# Patient Record
Sex: Male | Born: 2000 | Race: White | Hispanic: No | Marital: Single | Smoking: Current every day smoker
Health system: Southern US, Community
[De-identification: ages and names within clinical notes are randomized; demographics above are authoritative.]

## PROBLEM LIST (undated history)

## (undated) DIAGNOSIS — R519 Headache, unspecified: Secondary | ICD-10-CM

## (undated) DIAGNOSIS — D4989 Neoplasm of unspecified behavior of other specified sites: Secondary | ICD-10-CM

## (undated) DIAGNOSIS — F419 Anxiety disorder, unspecified: Secondary | ICD-10-CM

## (undated) DIAGNOSIS — F32A Depression, unspecified: Secondary | ICD-10-CM

## (undated) DIAGNOSIS — H539 Unspecified visual disturbance: Secondary | ICD-10-CM

## (undated) DIAGNOSIS — R51 Headache: Secondary | ICD-10-CM

## (undated) DIAGNOSIS — Z8489 Family history of other specified conditions: Secondary | ICD-10-CM

---
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
## 2021-12-08 DIAGNOSIS — F411 Generalized anxiety disorder: Secondary | ICD-10-CM | POA: Insufficient documentation

## 2021-12-08 DIAGNOSIS — K219 Gastro-esophageal reflux disease without esophagitis: Secondary | ICD-10-CM | POA: Insufficient documentation

## 2023-12-25 ENCOUNTER — Other Ambulatory Visit: Payer: Self-pay

## 2023-12-25 ENCOUNTER — Emergency Department (HOSPITAL_COMMUNITY)
Admission: EM | Admit: 2023-12-25 | Discharge: 2023-12-25 | Disposition: A | Discharge status: Home / Self Care | Attending: Emergency Medicine | Admitting: Emergency Medicine

## 2023-12-25 ENCOUNTER — Encounter (HOSPITAL_COMMUNITY): Payer: Self-pay

## 2023-12-25 DIAGNOSIS — L0231 Cutaneous abscess of buttock: Secondary | ICD-10-CM | POA: Insufficient documentation

## 2023-12-25 MED ORDER — DOXYCYCLINE HYCLATE 100 MG PO CAPS: 100.0000 mg | ORAL_CAPSULE | Freq: Two times a day (BID) | ORAL | 0 refills | Status: DC

## 2023-12-25 MED ORDER — DOXYCYCLINE HYCLATE 100 MG PO TABS
100.0000 mg | ORAL_TABLET | Freq: Once | ORAL | Status: AC
  Administered 2023-12-25: 100 mg via ORAL
  Filled 2023-12-25: qty 1

## 2023-12-25 NOTE — ED Notes (Signed)
 ED Provider at bedside.

## 2023-12-25 NOTE — ED Triage Notes (Signed)
 Pt states that he has abscess to his right butt check area that has been there approx 1 year. States that it has become painful and red for a while now.

## 2023-12-25 NOTE — ED Provider Notes (Signed)
  [place] EMERGENCY DEPARTMENT AT [facility] Provider Note   CSN: [id number] Arrival date & time: 12/25/23  1856     History {Add pertinent medical, surgical, social history, OB history to HPI:1} Chief Complaint  Patient presents with   Abscess    Spencer Flores is a 23 y.o. male.  The history is provided by the patient.       Home Medications Prior to Admission medications   Medication Sig Start Date End Date Taking? Authorizing Provider  doxycycline (VIBRAMYCIN) 100 MG capsule Take 1 capsule (100 mg total) by mouth 2 (two) times daily. 12/25/23  Yes Vidur Knust, PA-C  ibuprofen  (ADVIL ,MOTRIN ) 400 MG tablet Take 1 tablet (400 mg total) by mouth every 6 (six) hours as needed. Patient not taking: Reported on 06/17/2018 05/17/18   Sueellen Emery, MD  omeprazole  (PRILOSEC) 20 MG capsule Take 1 capsule (20 mg total) by mouth daily as needed. Patient not taking: Reported on 10/31/2018 06/17/18   Creighton Longley, PA-C      Allergies    No known allergies    Review of Systems   Review of Systems  Physical Exam Updated Vital Signs BP 116/79   Pulse 60   Temp 98.2 F (36.8 C) (Oral)   Resp 17   Wt 59 kg   SpO2 98%  Physical Exam  ED Results / Procedures / Treatments   Labs (all labs ordered are listed, but only abnormal results are displayed) Labs Reviewed - No data to display  EKG None  Radiology No results found.  Procedures Procedures  {Document cardiac monitor, telemetry assessment procedure when appropriate:1}  Medications Ordered in ED Medications  doxycycline (VIBRA-TABS) tablet 100 mg (100 mg Oral Given 12/25/23 2100)    ED Course/ Medical Decision Making/ A&P   {   Click here for ABCD2, HEART and other calculatorsREFRESH Note before signing :1}                              Medical Decision Making Risk Prescription drug management.   ***  {Document critical care time when appropriate:1} {Document review of labs and clinical  decision tools ie heart score, Chads2Vasc2 etc:1}  {Document your independent review of radiology images, and any outside records:1} {Document your discussion with family members, caretakers, and with consultants:1} {Document social determinants of health affecting pt's care:1} {Document your decision making why or why not admission, treatments were needed:1} Final Clinical Impression(s) / ED Diagnoses Final diagnoses:  Abscess of buttock, right    Rx / DC Orders ED Discharge Orders          Ordered    doxycycline (VIBRAMYCIN) 100 MG capsule  2 times daily        12/25/23 2049

## 2023-12-25 NOTE — ED Notes (Signed)
 Pt changed pharmacy during dc. EDPA made aware.

## 2023-12-25 NOTE — Discharge Instructions (Signed)
 Continue doing your warm water soaks as discussed.  I recommend twice daily for 5 to 10 minutes either with the shower water or as a tub soak.  Take the entire course of the antibiotics prescribed.  Since this continues to return you may need to consider definitive treatment by surgical removal of this site to keep this abscess from returning.  Dr. Collene Dawson, our general surgeon would be the person to discuss this with, call her if you decide to go this route.

## 2024-01-08 ENCOUNTER — Encounter: Payer: Self-pay | Admitting: General Surgery

## 2024-01-08 ENCOUNTER — Encounter: Payer: Self-pay | Admitting: *Deleted

## 2024-01-08 ENCOUNTER — Ambulatory Visit: Payer: Self-pay | Admitting: General Surgery

## 2024-01-08 VITALS — BP 104/68 | HR 48 | Temp 95.0°F | Resp 12 | Ht 72.0 in | Wt 127.0 lb

## 2024-01-08 DIAGNOSIS — K603 Anal fistula, unspecified: Secondary | ICD-10-CM | POA: Diagnosis not present

## 2024-01-08 MED ORDER — AMOXICILLIN-POT CLAVULANATE 875-125 MG PO TABS: 1.0000 | ORAL_TABLET | Freq: Two times a day (BID) | ORAL | 0 refills | Status: DC

## 2024-01-08 NOTE — Patient Instructions (Addendum)
 Take the antibiotic as prescribed.

## 2024-01-08 NOTE — Progress Notes (Signed)
 [facility] History and Physical  Reason for Referral:*** Referring Physician: ***  Chief Complaint   New Patient (Initial Visit)     Spencer Flores is a 23 y.o. male.  HPI:   Discussed the use of AI scribe software for clinical note transcription with the patient, who gave verbal consent to proceed.  History of Present Illness      ***.  The *** started *** and has had a duration of ***.  It is associated with ***.  The *** is improved with ***, and is made worse with ***.    Quality*** Context***  Past Medical History:  Diagnosis Date  . Family history of adverse reaction to anesthesia     Pt mother had spinal headache; pt grandmother had PONV  . Headache   . Tumor of jaw    left mandible  . Vision abnormalities    wears glasses    Past Surgical History:  Procedure Laterality Date  . MASS EXCISION Left 07/28/2016   Procedure: REMOVAL OF CYST IN LEFT JAW;  Surgeon: Glendia Primrose, DDS;  Location: [facility];  Service: Oral Surgery;  Laterality: Left;    Family History  Problem Relation Age of Onset  . Healthy Mother   . Diverticulitis Father   . Diabetes Maternal Grandmother     Social History   Tobacco Use  . Smoking status: Every Day    Types: E-cigarettes  . Smokeless tobacco: Never  Vaping Use  . Vaping status: Every Day  Substance Use Topics  . Alcohol use: No  . Drug use: Not Currently    Types: Marijuana    Medications: {medication reviewed/display:3041432} Allergies as of 01/08/2024       Reactions   No Known Allergies         Medication List        Accurate as of January 08, 2024 11:13 AM. If you have any questions, ask your nurse or doctor.          STOP taking these medications    omeprazole  20 MG capsule Commonly known as: PRILOSEC Stopped by: Manuelita JAYSON Pander       TAKE these medications    doxycycline  100 MG capsule Commonly known as: VIBRAMYCIN  Take 1 capsule (100 mg total) by mouth 2 (two) times  daily.   ibuprofen  400 MG tablet Commonly known as: ADVIL  Take 1 tablet (400 mg total) by mouth every 6 (six) hours as needed.   sertraline 50 MG tablet Commonly known as: ZOLOFT Take 50 mg by mouth daily.         ROS:  {Review of Systems:30496}  Blood pressure 104/68, pulse (!) 48, temperature (!) 95 F (35 C), temperature source Oral, resp. rate 12, height 6' (1.829 m), weight 127 lb (57.6 kg), SpO2 95%. Physical Exam Physical Exam   Results: No results found for this or any previous visit (from the past 48 hours).  No results found.   Assessment and Plan: Assessment and Plan Assessment & Plan      Spencer Flores is a 23 y.o. male with *** -*** -*** -Follow up ***  All questions were answered to the satisfaction of the patient and family***.  The risk and benefits of *** were discussed including but not limited to ***.  After careful consideration, Spencer Flores has decided to ***.    Manuelita JAYSON Pander 01/08/2024, 11:13 AM

## 2024-01-09 ENCOUNTER — Telehealth: Payer: Self-pay | Admitting: *Deleted

## 2024-01-09 DIAGNOSIS — K610 Anal abscess: Secondary | ICD-10-CM | POA: Insufficient documentation

## 2024-01-09 MED ORDER — AMOXICILLIN-POT CLAVULANATE 400-57 MG/5ML PO SUSR: 11.0000 mL | Freq: Two times a day (BID) | ORAL | 0 refills | Status: AC

## 2024-01-09 NOTE — H&P (Signed)
 [facility] History and Physical   Reason for Referral: Recurrent right perianal abscess, concern for fistula in ano  Referring Physician: ED    Chief Complaint   New Patient (Initial Visit)        Spencer Flores is a 23 y.o. male.  HPI:    Discussed the use of AI scribe software for clinical note transcription with the patient, who gave verbal consent to proceed.   History of Present Illness Spencer Flores is a 23 year old male who presents with recurrent abscess near the anus.   Approximately two weeks ago, he visited the emergency room due to a draining spot on his right buttock near the anus. The abscess was small and already draining, so no incision was required. He was prescribed antibiotics at that time.   He has a history of similar occurrences over the past one to two years. Previously, he would notice a small head on the abscess, which he would pop, releasing purulent material. The abscess would then temporarily resolve but would recur. Recently, the abscesses have been getting significantly larger, especially after manipulation.   His work involves a lot of walking, which causes sweating in the area, potentially exacerbating the condition. He describes the drainage as usually light pink, resembling blood, and mentions that the abscess feels 'rooted' near the anus.   No drainage or pain directly from the anus, constipation, or pain during bowel movements. He reports regular bowel movements, and no pain.    He manages the abscess by sitting in the shower and popping it when it becomes severe, which results in a significant amount of fluid being expelled. He does this multiple times over the past few years.    He takes Zoloft and smokes vapes.             Past Medical History:  Diagnosis Date   Family history of adverse reaction to anesthesia       Pt mother had spinal headache; pt grandmother had PONV   Headache     Tumor of jaw      left  mandible   Vision abnormalities      wears glasses               Past Surgical History:  Procedure Laterality Date   MASS EXCISION Left 07/28/2016    Procedure: REMOVAL OF CYST IN LEFT JAW;  Surgeon: Glendia Primrose, DDS;  Location: [facility] OR;  Service: Oral Surgery;  Laterality: Left;               Family History  Problem Relation Age of Onset   Healthy Mother     Diverticulitis Father     Diabetes Maternal Grandmother            Social History  Social History         Tobacco Use   Smoking status: Every Day      Types: E-cigarettes   Smokeless tobacco: Never  Vaping Use   Vaping status: Every Day  Substance Use Topics   Alcohol use: No   Drug use: Not Currently      Types: Marijuana        Medications: I have reviewed the patient's current medications. Allergies as of 01/08/2024         Reactions    No Known Allergies              Medication List  Accurate as of January 08, 2024 11:13 AM. If you have any questions, ask your nurse or doctor.              STOP taking these medications     omeprazole  20 MG capsule Commonly known as: PRILOSEC Stopped by: Manuelita JAYSON Pander           TAKE these medications     doxycycline  100 MG capsule Commonly known as: VIBRAMYCIN  Take 1 capsule (100 mg total) by mouth 2 (two) times daily.    ibuprofen  400 MG tablet Commonly known as: ADVIL  Take 1 tablet (400 mg total) by mouth every 6 (six) hours as needed.    sertraline 50 MG tablet Commonly known as: ZOLOFT Take 50 mg by mouth daily.               ROS:  A comprehensive review of systems was negative except for: Gastrointestinal: positive for reflux symptoms and perianal pain and abscess Neurological: positive for headaches   Blood pressure 104/68, pulse (!) 48, temperature (!) 95 F (35 C), temperature source Oral, resp. rate 12, height 6' (1.829 m), weight 127 lb (57.6 kg), SpO2 95%.   Physical Exam GENERAL: Alert, cooperative, well  developed, no acute distress. HEENT: Normocephalic, normal oropharynx, moist mucous membranes. CHEST: Clear to auscultation bilaterally, no wheezes, rhonchi, or crackles. CARDIOVASCULAR: Normal heart rate and rhythm ABDOMEN: Soft, non-tender, non-distended, without organomegaly, normal bowel sounds. RECTAL: Right buttock near anus with papilla and fluid. Fluctuant with some active purulent drainage, minimally tender, chaperone present EXTREMITIES: No cyanosis, edema, or leg swelling. NEUROLOGICAL: Cranial nerves grossly intact, moves all extremities without gross motor or sensory deficit.   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 Discussed exam under anesthesia to evaluate fistula and sphincter involvement. Explained procedure and risk of bleeding, infection, need for seton, injury to muscle, incontinence risk.  - Schedule exam under anesthesia with possible fistulotomy, seton placement, or cyst excision. - Provide post-procedure care instructions, including sitz baths and bowel movement maintenance. - Discuss potential referral to colorectal surgery if high fistula is present. - Antibiotics , Augmentin sent in for the active purulence. The Ed treated with doxycycline  which is not appropriate for this location of infection.      All questions were answered to the satisfaction of the patient.   Manuelita JAYSON Pander 01/08/2024, 11:13 AM

## 2024-01-09 NOTE — Telephone Encounter (Signed)
Patient mother returned call and made aware.  

## 2024-01-09 NOTE — Telephone Encounter (Signed)
 Received call from patient mother, Crystal [phone]~ telephone.   Mother reports that patient has long standing history of dysphagia and has  undergone multiple esophageal dilations. Reports that patient is unable to swallow Augmentin tablet whole. Reports that patient has tried to break tablets in half, but was still unable to swallow them. Reports that ABTx was crushed, but patient could not tolerate taste of crushed tablet and vomited.   Requested to change ABTx to smaller tablet or to change to liquid.   Dr. Kallie made aware and agreeable to changing to liquid with comparable dosing.   Call placed to pharmacy. Comparable dosing of Augmentin 875- 125 mg PO BID x10 days is Augmentin 400-57 mg/ 5 mL suspension- take 11 mL PO BID x 10 days. Of note, the calculated dose of 11 mL 2 times daily exceeds recommended dose limit of 10.9375 mL. Discussed with [facility] pharmacist Beverley, who felt over limit as inconsequential.   Prescription sent to pharmacy. Call placed to patient mother. LMTRC.

## 2024-01-14 ENCOUNTER — Inpatient Hospital Stay (HOSPITAL_COMMUNITY)
Admission: RE | Admit: 2024-01-14 | Discharge: 2024-01-14 | Disposition: A | Discharge status: Home / Self Care | Source: Ambulatory Visit

## 2024-01-14 HISTORY — DX: Anxiety disorder, unspecified: F41.9

## 2024-01-14 HISTORY — DX: Depression, unspecified: F32.A

## 2024-01-14 NOTE — Pre-Procedure Instructions (Signed)
 Attempted pre-op phone call. Left VM for him to call us back.

## 2024-01-15 ENCOUNTER — Encounter (HOSPITAL_COMMUNITY): Payer: Self-pay

## 2024-01-17 ENCOUNTER — Ambulatory Visit (HOSPITAL_COMMUNITY)

## 2024-01-17 ENCOUNTER — Encounter (HOSPITAL_COMMUNITY)
Admission: RE | Disposition: A | Discharge status: Home / Self Care | Payer: Self-pay | Source: Home / Self Care | Attending: General Surgery

## 2024-01-17 ENCOUNTER — Ambulatory Visit (HOSPITAL_COMMUNITY)
Admission: RE | Admit: 2024-01-17 | Discharge: 2024-01-17 | Disposition: A | Discharge status: Home / Self Care | Attending: General Surgery | Admitting: General Surgery

## 2024-01-17 ENCOUNTER — Other Ambulatory Visit: Payer: Self-pay

## 2024-01-17 ENCOUNTER — Encounter (HOSPITAL_COMMUNITY): Payer: Self-pay | Admitting: General Surgery

## 2024-01-17 DIAGNOSIS — K610 Anal abscess: Secondary | ICD-10-CM | POA: Insufficient documentation

## 2024-01-17 DIAGNOSIS — K603 Anal fistula, unspecified: Secondary | ICD-10-CM | POA: Diagnosis present

## 2024-01-17 DIAGNOSIS — F418 Other specified anxiety disorders: Secondary | ICD-10-CM | POA: Diagnosis not present

## 2024-01-17 DIAGNOSIS — F17290 Nicotine dependence, other tobacco product, uncomplicated: Secondary | ICD-10-CM | POA: Diagnosis not present

## 2024-01-17 HISTORY — PX: ANAL FISTULOTOMY: SHX6423

## 2024-01-17 SURGERY — ANAL FISTULOTOMY
Anesthesia: General | Site: Rectum

## 2024-01-17 MED ORDER — LACTATED RINGERS IV SOLN: INTRAVENOUS | Status: DC | PRN

## 2024-01-17 MED ORDER — HEMOSTATIC AGENTS (NO CHARGE) OPTIME
TOPICAL | Status: DC | PRN
Start: 2024-01-17 — End: 2024-01-17
  Administered 2024-01-17: 1 via TOPICAL

## 2024-01-17 MED ORDER — LACTATED RINGERS IV SOLN: INTRAVENOUS | Status: DC

## 2024-01-17 MED ORDER — PROPOFOL 500 MG/50ML IV EMUL
INTRAVENOUS | Status: AC
  Filled 2024-01-17: qty 50

## 2024-01-17 MED ORDER — MIDAZOLAM HCL 2 MG/2ML IJ SOLN
INTRAMUSCULAR | Status: DC | PRN
  Administered 2024-01-17: 2 mg via INTRAVENOUS

## 2024-01-17 MED ORDER — SODIUM CHLORIDE 0.9 % IV SOLN
2.0000 g | INTRAVENOUS | Status: AC
  Administered 2024-01-17: 2 g via INTRAVENOUS
  Filled 2024-01-17: qty 2

## 2024-01-17 MED ORDER — CHLORHEXIDINE GLUCONATE 0.12 % MT SOLN: 15.0000 mL | Freq: Once | OROMUCOSAL | Status: AC

## 2024-01-17 MED ORDER — OXYCODONE HCL 5 MG PO TABS: 5.0000 mg | ORAL_TABLET | ORAL | 0 refills | Status: DC | PRN

## 2024-01-17 MED ORDER — FENTANYL CITRATE (PF) 100 MCG/2ML IJ SOLN
INTRAMUSCULAR | Status: AC
  Filled 2024-01-17: qty 2

## 2024-01-17 MED ORDER — HYDROGEN PEROXIDE 3 % EX SOLN
CUTANEOUS | Status: DC | PRN
  Administered 2024-01-17: 1

## 2024-01-17 MED ORDER — BUPIVACAINE HCL (PF) 0.5 % IJ SOLN
INTRAMUSCULAR | Status: DC | PRN
Start: 2024-01-17 — End: 2024-01-17
  Administered 2024-01-17: 30 mL

## 2024-01-17 MED ORDER — PROPOFOL 500 MG/50ML IV EMUL
INTRAVENOUS | Status: AC
Start: 2024-01-17 — End: 2024-01-17
  Filled 2024-01-17: qty 50

## 2024-01-17 MED ORDER — LIDOCAINE 2% (20 MG/ML) 5 ML SYRINGE
INTRAMUSCULAR | Status: DC | PRN
  Administered 2024-01-17: 60 mg via INTRAVENOUS

## 2024-01-17 MED ORDER — PROPOFOL 10 MG/ML IV BOLUS
INTRAVENOUS | Status: AC
Start: 2024-01-17 — End: 2024-01-17
  Filled 2024-01-17: qty 20

## 2024-01-17 MED ORDER — CHLORHEXIDINE GLUCONATE CLOTH 2 % EX PADS: 6.0000 | MEDICATED_PAD | Freq: Once | CUTANEOUS | Status: DC

## 2024-01-17 MED ORDER — ONDANSETRON HCL 4 MG PO TABS: 4.0000 mg | ORAL_TABLET | Freq: Three times a day (TID) | ORAL | 1 refills | Status: DC | PRN

## 2024-01-17 MED ORDER — SODIUM CHLORIDE 0.9 % IR SOLN
Status: DC | PRN
  Administered 2024-01-17: 1000 mL

## 2024-01-17 MED ORDER — DOCUSATE SODIUM 100 MG PO CAPS
100.0000 mg | ORAL_CAPSULE | Freq: Two times a day (BID) | ORAL | 2 refills | Status: AC
Start: 2024-01-17 — End: 2025-01-16

## 2024-01-17 MED ORDER — CHLORHEXIDINE GLUCONATE 0.12 % MT SOLN
OROMUCOSAL | Status: AC
  Administered 2024-01-17: 15 mL via OROMUCOSAL
  Filled 2024-01-17: qty 15

## 2024-01-17 MED ORDER — FENTANYL CITRATE (PF) 250 MCG/5ML IJ SOLN
INTRAMUSCULAR | Status: DC | PRN
  Administered 2024-01-17 (×4): 25 ug via INTRAVENOUS

## 2024-01-17 MED ORDER — ONDANSETRON HCL 4 MG/2ML IJ SOLN
INTRAMUSCULAR | Status: DC | PRN
  Administered 2024-01-17: 4 mg via INTRAVENOUS

## 2024-01-17 MED ORDER — METHYLENE BLUE (ANTIDOTE) 1 % IV SOLN
INTRAVENOUS | Status: DC | PRN
  Administered 2024-01-17: 1 mL

## 2024-01-17 MED ORDER — BUPIVACAINE HCL (PF) 0.5 % IJ SOLN
INTRAMUSCULAR | Status: AC
  Filled 2024-01-17: qty 30

## 2024-01-17 MED ORDER — LIDOCAINE VISCOUS HCL 2 % MT SOLN
OROMUCOSAL | Status: AC
  Filled 2024-01-17: qty 15

## 2024-01-17 MED ORDER — PROPOFOL 10 MG/ML IV BOLUS
INTRAVENOUS | Status: DC | PRN
  Administered 2024-01-17: 175 ug/kg/min via INTRAVENOUS
  Administered 2024-01-17: 50 mg via INTRAVENOUS
  Administered 2024-01-17: 30 mg via INTRAVENOUS
  Administered 2024-01-17: 150 ug/kg/min via INTRAVENOUS

## 2024-01-17 MED ORDER — DEXMEDETOMIDINE HCL IN NACL 80 MCG/20ML IV SOLN
INTRAVENOUS | Status: DC | PRN
  Administered 2024-01-17 (×4): 4 ug via INTRAVENOUS

## 2024-01-17 MED ORDER — ORAL CARE MOUTH RINSE: 15.0000 mL | Freq: Once | OROMUCOSAL | Status: AC

## 2024-01-17 MED ORDER — MIDAZOLAM HCL 2 MG/2ML IJ SOLN
INTRAMUSCULAR | Status: AC
  Filled 2024-01-17: qty 2

## 2024-01-17 MED ORDER — METHYLENE BLUE (ANTIDOTE) 1 % IV SOLN
INTRAVENOUS | Status: AC
Start: 2024-01-17 — End: 2024-01-17
  Filled 2024-01-17: qty 10

## 2024-01-17 SURGICAL SUPPLY — 29 items
BAG HAMPER (MISCELLANEOUS) ×2 IMPLANT
CLOTH BEACON ORANGE TIMEOUT ST (SAFETY) ×2 IMPLANT
COVER LIGHT HANDLE (MISCELLANEOUS) ×2 IMPLANT
DRAPE HALF SHEET 40X57 (DRAPES) ×2 IMPLANT
DRSG TEGADERM 4X10 (GAUZE/BANDAGES/DRESSINGS) ×1 IMPLANT
ELECTRODE REM PT RTRN 9FT ADLT (ELECTROSURGICAL) ×2 IMPLANT
GAUZE SPONGE 4X4 12PLY STRL (GAUZE/BANDAGES/DRESSINGS) ×4 IMPLANT
GLOVE BIO SURGEON STRL SZ 6.5 (GLOVE) ×2 IMPLANT
GLOVE BIOGEL PI IND STRL 6.5 (GLOVE) ×2 IMPLANT
GLOVE BIOGEL PI IND STRL 7.0 (GLOVE) ×4 IMPLANT
GOWN STRL REUS W/TWL LRG LVL3 (GOWN DISPOSABLE) ×4 IMPLANT
HEMOSTAT SURGICEL 4X8 (HEMOSTASIS) ×3 IMPLANT
KIT TURNOVER CYSTO (KITS) ×2 IMPLANT
MANIFOLD NEPTUNE II (INSTRUMENTS) ×2 IMPLANT
NDL HYPO 18GX1.5 BLUNT FILL (NEEDLE) ×1 IMPLANT
NDL HYPO 21X1.5 SAFETY (NEEDLE) ×1 IMPLANT
NEEDLE HYPO 18GX1.5 BLUNT FILL (NEEDLE) ×2 IMPLANT
NEEDLE HYPO 21X1.5 SAFETY (NEEDLE) ×2 IMPLANT
NS IRRIG 1000ML POUR BTL (IV SOLUTION) ×2 IMPLANT
PACK PERI GYN (CUSTOM PROCEDURE TRAY) ×2 IMPLANT
PAD ABD 5X9 TENDERSORB (GAUZE/BANDAGES/DRESSINGS) ×1 IMPLANT
PAD ARMBOARD POSITIONER FOAM (MISCELLANEOUS) ×2 IMPLANT
POSITIONER HEAD 8X9X4 ADT (SOFTGOODS) ×2 IMPLANT
SET BASIN LINEN APH (SET/KITS/TRAYS/PACK) ×2 IMPLANT
SPONGE SURGIFOAM ABS GEL 100 (HEMOSTASIS) ×2 IMPLANT
SURGILUBE 2OZ TUBE FLIPTOP (MISCELLANEOUS) ×2 IMPLANT
SUT SILK 0 FSL (SUTURE) ×2 IMPLANT
SYR 30ML LL (SYRINGE) ×2 IMPLANT
SYR BULB IRRIG 60ML STRL (SYRINGE) ×2 IMPLANT

## 2024-01-17 NOTE — Op Note (Signed)
 [facility] Operative Note  01/17/24  Preoperative Diagnosis: Perianal abscess, possible fistula in ano   Postoperative Diagnosis: Perianal abscess, superficial fistula in ano   Procedure(s) Performed: Superficial Fistulotomy and excision of abscess cavity    Surgeon: Manuelita BROCKS. Kallie, MD   Assistants: No qualified resident was available    Anesthesia: General endotracheal   Anesthesiologist: Herschell Hollering, MD    Specimens: Abscess cavity and track   Estimated Blood Loss: Minimal   Blood Replacement: None    Complications: None   Wound Class: Dirty infected    Operative Indications: Mr. Bernardini is a 23 yo who has had recurrent perianal abscess for years and concern for fistula. We discussed exam under anesthesia, possible fistulotomy versus seton versus cyst excision. Discussed risk of bleeding, infection, need for seton and more surgery, injury to sphincter muscles.   Findings: Superficial fistula from abscess cavity at right buttock 7 O'clock to anal verge between 6-7 O'clock position    Procedure: The patient was taken to the operating room and placed supine. General endotracheal anesthesia was induced. Intravenous antibiotics were  administered per protocol.  Hew as then placed in lithotomy with all pressure points padded.  The  perianal and buttock region were prepped and draped in the normal sterile fashion.  A digital rectal exam revealed no pathology.  I placed a anoscope in the anus and found no obvious pathology. I palpated the indurated abscess cavity and noted an area where prior drainage of the abscess cavity had occurred.  I could not get any mammary probes to enter this as it was sealed.  I then injected the cavity with peroxide while visualizing inside the anus with an anoscope. No obvious fistula was noted. I opened up the cavity and injected methylene blue and still noted no obvious blue in the anal canal.  I then noted an area where peroxide  was bubbling out, and probed this area and found a track with the mammary probe. This tracked up from the abscess cavity to the 6-7 O'clock position at the anal verge. This was superficial in nature, involving no muscle. I opened this up with cautery, and excised the abscess cavity at was at the buttock and part of the track.  The area was made hemostatic. I injected Marcaine . I packed the area with Surgicel.   Final inspection revealed acceptable hemostasis. All counts were correct at the end of the case. The patient was awakened from anesthesia and extubated without complication.  The patient went to the PACU in stable condition.   Manuelita Kallie, MD [facility] [address] [place], [place] [address] [phone] (office)

## 2024-01-17 NOTE — Anesthesia Preprocedure Evaluation (Addendum)
 Anesthesia Evaluation  Patient identified by MRN, date of birth, ID band Patient awake    Reviewed: Allergy & Precautions, H&P , NPO status , Patient's Chart, lab work & pertinent test results  History of Anesthesia Complications (+) Family history of anesthesia reaction  Airway Mallampati: II  TM Distance: >3 FB Neck ROM: Full    Dental no notable dental hx.    Pulmonary Current SmokerPatient did not abstain from smoking. Patient vaped marijuana at 0600 Lungs clear   Pulmonary exam normal breath sounds clear to auscultation       Cardiovascular negative cardio ROS Normal cardiovascular exam Rhythm:Regular Rate:Normal     Neuro/Psych  Headaches PSYCHIATRIC DISORDERS Anxiety Depression       GI/Hepatic negative GI ROS, Neg liver ROS,,,  Endo/Other  negative endocrine ROS    Renal/GU negative Renal ROS  negative genitourinary   Musculoskeletal negative musculoskeletal ROS (+)    Abdominal   Peds negative pediatric ROS (+)  Hematology negative hematology ROS (+)   Anesthesia Other Findings   Reproductive/Obstetrics negative OB ROS                              Anesthesia Physical Anesthesia Plan  ASA: 1  Anesthesia Plan: General   Post-op Pain Management:    Induction: Intravenous  PONV Risk Score and Plan: Propofol  infusion  Airway Management Planned: Nasal Cannula  Additional Equipment:   Intra-op Plan:   Post-operative Plan:   Informed Consent: I have reviewed the patients History and Physical, chart, labs and discussed the procedure including the risks, benefits and alternatives for the proposed anesthesia with the patient or authorized representative who has indicated his/her understanding and acceptance.     Dental advisory given  Plan Discussed with: CRNA  Anesthesia Plan Comments:          Anesthesia Quick Evaluation

## 2024-01-17 NOTE — Progress Notes (Signed)
 [facility]  Updated mom[facility]. Will see 7/8 in the office. Sitz baths 3-4 times a day.  Start Sitz bath this evening.  Future Appointments  Date Time Provider Department Center  01/22/2024 12:45 PM Kallie Manuelita BROCKS, MD [facility] None     Manuelita Kallie, MD [facility] [address] [place], [place] [address] [phone] (office)

## 2024-01-17 NOTE — Progress Notes (Signed)
 Patient needs to be off work July 3 to July 10 per Dr. Kallie Almarie Shams, RN

## 2024-01-17 NOTE — Interval H&P Note (Signed)
 History and Physical Interval Note:  01/17/2024 8:30 AM  Spencer Flores  has presented today for surgery, with the diagnosis of FISTULA IN [place].  The various methods of treatment have been discussed with the patient and family. After consideration of risks, benefits and other options for treatment, the patient has consented to  Procedure(s) with comments: EXAM UNDER ANESTHESIA, RECTUM (N/A) - POSSIBLE FISTULOTOMY (53729)  POSSIBLE SETON (53979) as a surgical intervention.  The patient's history has been reviewed, patient examined, no change in status, stable for surgery.  I have reviewed the patient's chart and labs.  Questions were answered to the patient's satisfaction.     Spencer Flores

## 2024-01-17 NOTE — Discharge Instructions (Addendum)
 Anal Fistula Discharge Instructions:   A fistula is an abnormal tunnel that connects two structures. An anal fistula is an abnormal tunnel that forms between the anus and the surrounding skin. The fistula's internal opening is in the anus and the external opening is typically on the skin around the anus or on the buttocks.  You had a fistulotomy to open up that tunnel and the abscess cavity was removed.   You have some packing in the area that will dissolve and may possibly fall out on its own or fall out at the first sitz bath. If you see the packing in the area after the sitz bath. Pull it out.   Sitz Baths: I want you to take sitz baths starting this evening. Take at least one today/ this evening or after a BM from today.  After today, I want you to take a sitz bath at least 3-4 times a day until you see me in the office.   Activity:  Many individuals are able to return to work and resume routine activities the day after their procedure. Some people require a week off from work if the surgery is more extensive. Generally, within 1-2 weeks, surgical discomfort is Minimal.  Care Instructions: You can resume your normal diet once you have sufficiently recovered from anesthesia. You should drink lots of liquid. Water is best. Try to drink at least 6-8 glasses of liquid daily.  Medications: It is very important to prevent constipation after surgery. You should take a stool softener, such as docusate sodium (Colace), twice a day for the first two weeks. Also take a fiber supplement such as Benefiber, Citrucel, or Metamucil, twice a day every day. Consult your pharmacist if you need help. If your stools become loose, you can stop taking the softeners.  Take tylenol  and ibuprofen  as needed for pain control, alternating every 4-6 hours.  Example:  Tylenol  1000mg  @ 6am, 12noon, 6pm, (Do not exceed 4000mg  of tylenol  a day).  Ibuprofen  800mg  @ 9am, 3pm, 9pm, 3am (Do not exceed 3600mg  of  ibuprofen  a day).  Take Roxicodone for breakthrough pain every 4 hours.  Drink plenty of water to also prevent constipation.  Do not drive, operate machinery, or drink alcohol when taking narcotic pain medication.  Within a few days, your pain should be sufficiently controlled with medications such as ibuprofen  or acetaminophen .  Dressing: You can replace your pad as needed after surgery. Feminine pads work best.  It is normal to see some bloody drainage on the dressing.  Bleeding should not be heavy or continuous.   Hygiene: Keep the surgical area clean by taking Sitz baths (or tub baths) several times per day. These are helpful for cleaning after each bowel movement. Frequent showering is an alternative to baths, although many patients find baths soothing after surgery.  Equipment for [employer] can be purchased at [employer] or medical/surgical supply stores. o Use warm (not hot) water for cleansing. You can put epsome salt in the bath water if desired.  o Pat the area dry or use a hairdryer to evaporate any residual moisture.  o If you use a hairdryer, use the cool or warm setting, to avoid potential heat injury to the skin.  Sitting on a pillow or ice pack may also provide relief. Do not apply ice directly to the skin, use a towel or pillow case as a buffer. We do not encourage sitting on an inflatable ring ("doughnut") as this leads to unopposed downward  pressure in the anal area.  What symptoms should I expect?  It is normal to have pain for up to 1-2 weeks. Thereafter, you may notice discomfort with prolonged sitting and certain activities. Pain should not be constant or worsening.

## 2024-01-17 NOTE — Progress Notes (Signed)
 On arrival to post op dressings assessed , no drainage, guaze and mesh underwear in place, No drainage on discharge to home

## 2024-01-17 NOTE — Transfer of Care (Signed)
 Immediate Anesthesia Transfer of Care Note  Patient: Spencer Flores  Procedure(s) Performed: ANAL FISTULOTOMY, EXCISION OF ABCESS CAVITY (Rectum)  Patient Location: Short Stay  Anesthesia Type:General  Level of Consciousness: drowsy  Airway & Oxygen Therapy: Patient Spontanous Breathing  Post-op Assessment: Report given to RN and Post -op Vital signs reviewed and stable  Post vital signs: Reviewed and stable  Last Vitals:  Vitals Value Taken Time  BP 83/48 01/17/24 11:03  Temp 36.3 C 01/17/24 11:03  Pulse 46 01/17/24 11:03  Resp 28 01/17/24 11:03  SpO2 98 % 01/17/24 11:03    Last Pain:  Vitals:   01/17/24 1103  TempSrc: Oral  PainSc: Asleep      Patients Stated Pain Goal: 3 (01/17/24 0756)  Complications: No notable events documented.

## 2024-01-17 NOTE — Anesthesia Postprocedure Evaluation (Signed)
 Anesthesia Post Note  Patient: Spencer Flores  Procedure(s) Performed: ANAL FISTULOTOMY, EXCISION OF ABCESS CAVITY (Rectum)  Patient location during evaluation: PACU Anesthesia Type: General Level of consciousness: awake and alert Pain management: pain level controlled Vital Signs Assessment: post-procedure vital signs reviewed and stable Respiratory status: spontaneous breathing, nonlabored ventilation, respiratory function stable and patient connected to nasal cannula oxygen Cardiovascular status: blood pressure returned to baseline and stable Postop Assessment: no apparent nausea or vomiting Anesthetic complications: no   No notable events documented.   Last Vitals:  Vitals:   01/17/24 1103 01/17/24 1115  BP: (!) 83/48 (!) 83/47  Pulse: (!) 46 (!) 41  Resp: (!) 28 19  Temp: (!) 36.3 C   SpO2: 98% 98%    Last Pain:  Vitals:   01/17/24 1103  TempSrc: Oral  PainSc: Asleep                 Andrea Limes

## 2024-01-18 ENCOUNTER — Encounter (HOSPITAL_COMMUNITY): Payer: Self-pay | Admitting: General Surgery

## 2024-01-22 ENCOUNTER — Encounter: Payer: Self-pay | Admitting: *Deleted

## 2024-01-22 ENCOUNTER — Encounter: Payer: Self-pay | Admitting: General Surgery

## 2024-01-22 ENCOUNTER — Other Ambulatory Visit: Payer: Self-pay

## 2024-01-22 ENCOUNTER — Ambulatory Visit (INDEPENDENT_AMBULATORY_CARE_PROVIDER_SITE_OTHER): Admitting: General Surgery

## 2024-01-22 VITALS — BP 100/62 | HR 58 | Temp 98.1°F | Resp 16 | Ht 72.0 in | Wt 128.0 lb

## 2024-01-22 DIAGNOSIS — K603 Anal fistula, unspecified: Secondary | ICD-10-CM

## 2024-01-22 LAB — SURGICAL PATHOLOGY

## 2024-01-22 NOTE — Patient Instructions (Signed)
 Continue sitz baths as needed for pain and keep your stools regular. Do sitz baths after Bms.  Take tylenol  and ibuprofen  for pain.  Call with issues between now and the next appt.

## 2024-01-22 NOTE — Progress Notes (Signed)
 [facility]  Doing well. Keep area clean.  BP 100/62   Pulse (!) 58   Temp 98.1 F (36.7 C) (Oral)   Resp 16   Ht 6' (1.829 m)   Wt 128 lb (58.1 kg)   SpO2 96%   BMI 17.36 kg/m  Granulating and minimal fibrinous tissue. Healing No erythema minor drainage  Patient s/p fistulotomy. Doing well overall.   Continue sitz baths as needed for pain and keep your stools regular. Do sitz baths after Bms.  Take tylenol  and ibuprofen  for pain.  Call with issues between now and the next appt.  Will keep you out of work until 7/23 when I see you next to ensure you are healing and able to keep area clean esp with working out side in the heat/ sweating, etc.  Future Appointments  Date Time Provider Department Center  02/06/2024  9:30 AM Kallie Manuelita BROCKS, MD [facility] None   Manuelita Kallie, MD [facility] [address] [place], [place] [address] [phone] (office)

## 2024-01-24 ENCOUNTER — Ambulatory Visit: Payer: Self-pay | Admitting: Pediatrics

## 2024-01-24 NOTE — Progress Notes (Signed)
 Not our patient

## 2024-02-06 ENCOUNTER — Encounter: Payer: Self-pay | Admitting: *Deleted

## 2024-02-06 ENCOUNTER — Ambulatory Visit (INDEPENDENT_AMBULATORY_CARE_PROVIDER_SITE_OTHER): Admitting: General Surgery

## 2024-02-06 ENCOUNTER — Encounter: Payer: Self-pay | Admitting: General Surgery

## 2024-02-06 VITALS — BP 111/68 | HR 71 | Temp 98.0°F | Resp 12 | Ht 72.0 in | Wt 128.0 lb

## 2024-02-06 DIAGNOSIS — K603 Anal fistula, unspecified: Secondary | ICD-10-CM

## 2024-02-06 NOTE — Patient Instructions (Signed)
 Continue sitz baths and keeping area clean after Bms.  You can do some peroxide to the area to irritate the skin to get it to close up.  Call with issues.

## 2024-02-06 NOTE — Progress Notes (Signed)
 [facility]  Feeling better. Says minimal drainage. Having bms. Ready to go back to work.  BP 111/68   Pulse 71   Temp 98 F (36.7 C) (Oral)   Resp 12   Ht 6' (1.829 m)   Wt 128 lb (58.1 kg)   SpO2 97%   BMI 17.36 kg/m  Granulating area right buttock region, superficial, about the size of a small peach pit that is not epithelized.   Patient s/p fistulotomy for fistula in ano. Doing well. Wants to go back to work.   Continue sitz baths and keeping area clean after Bms.  You can do some peroxide to the area to irritate the skin to get it to close up.  Call with issues.   Future Appointments  Date Time Provider Department Center  02/27/2024  9:15 AM Kallie Manuelita BROCKS, MD [facility] None   Manuelita Kallie, MD [facility] [address] [place], [place] [address] [phone] (office)

## 2024-02-27 ENCOUNTER — Encounter: Admitting: General Surgery
